# Patient Record
Sex: Female | Born: 1960 | Race: Black or African American | Hispanic: No | Marital: Married | Smoking: Never smoker
Health system: Southern US, Community
[De-identification: ages and names within clinical notes are randomized; demographics above are authoritative.]

---

## 1999-06-22 ENCOUNTER — Other Ambulatory Visit: Admission: RE | Admit: 1999-06-22 | Discharge: 1999-06-22 | Payer: Self-pay | Admitting: Obstetrics and Gynecology

## 2000-02-02 ENCOUNTER — Other Ambulatory Visit: Admission: RE | Admit: 2000-02-02 | Discharge: 2000-02-02 | Payer: Self-pay | Admitting: Unknown Physician Specialty

## 2001-12-17 ENCOUNTER — Ambulatory Visit (HOSPITAL_COMMUNITY): Admission: RE | Admit: 2001-12-17 | Discharge: 2001-12-17 | Payer: Self-pay

## 2001-12-31 ENCOUNTER — Ambulatory Visit (HOSPITAL_COMMUNITY): Admission: RE | Admit: 2001-12-31 | Discharge: 2001-12-31 | Payer: Self-pay | Admitting: *Deleted

## 2001-12-31 ENCOUNTER — Encounter: Payer: Self-pay | Admitting: *Deleted

## 2002-04-10 ENCOUNTER — Inpatient Hospital Stay (HOSPITAL_COMMUNITY): Admission: AD | Admit: 2002-04-10 | Discharge: 2002-04-10 | Payer: Self-pay

## 2002-04-15 ENCOUNTER — Encounter (HOSPITAL_COMMUNITY): Admission: RE | Admit: 2002-04-15 | Discharge: 2002-05-15 | Payer: Self-pay

## 2002-05-20 ENCOUNTER — Encounter (HOSPITAL_COMMUNITY): Admission: RE | Admit: 2002-05-20 | Discharge: 2002-05-20 | Payer: Self-pay

## 2002-05-27 ENCOUNTER — Inpatient Hospital Stay (HOSPITAL_COMMUNITY): Admission: AD | Admit: 2002-05-27 | Discharge: 2002-05-31 | Payer: Self-pay

## 2002-09-28 ENCOUNTER — Encounter: Admission: RE | Admit: 2002-09-28 | Discharge: 2002-10-28 | Payer: Self-pay

## 2002-11-27 ENCOUNTER — Encounter: Admission: RE | Admit: 2002-11-27 | Discharge: 2002-12-27 | Payer: Self-pay

## 2003-01-27 ENCOUNTER — Encounter: Admission: RE | Admit: 2003-01-27 | Discharge: 2003-02-26 | Payer: Self-pay

## 2004-01-06 ENCOUNTER — Other Ambulatory Visit: Admission: RE | Admit: 2004-01-06 | Discharge: 2004-01-06 | Payer: Self-pay | Admitting: Obstetrics and Gynecology

## 2004-09-25 ENCOUNTER — Emergency Department (HOSPITAL_COMMUNITY): Admission: EM | Admit: 2004-09-25 | Discharge: 2004-09-25 | Payer: Self-pay | Admitting: Emergency Medicine

## 2006-12-20 IMAGING — CR DG CHEST 2V
2 series · 2 of 2 positions shown · non-contrast
Comparison: none

CLINICAL DATA: Cough and fever with nausea and vomiting.
 PA AND LATERAL CHEST, 09/25/04:
 The heart size and mediastinal contours are normal.  The lungs are clear.  The visualized skeleton is unremarkable.

[view not recorded (1 of 2)]
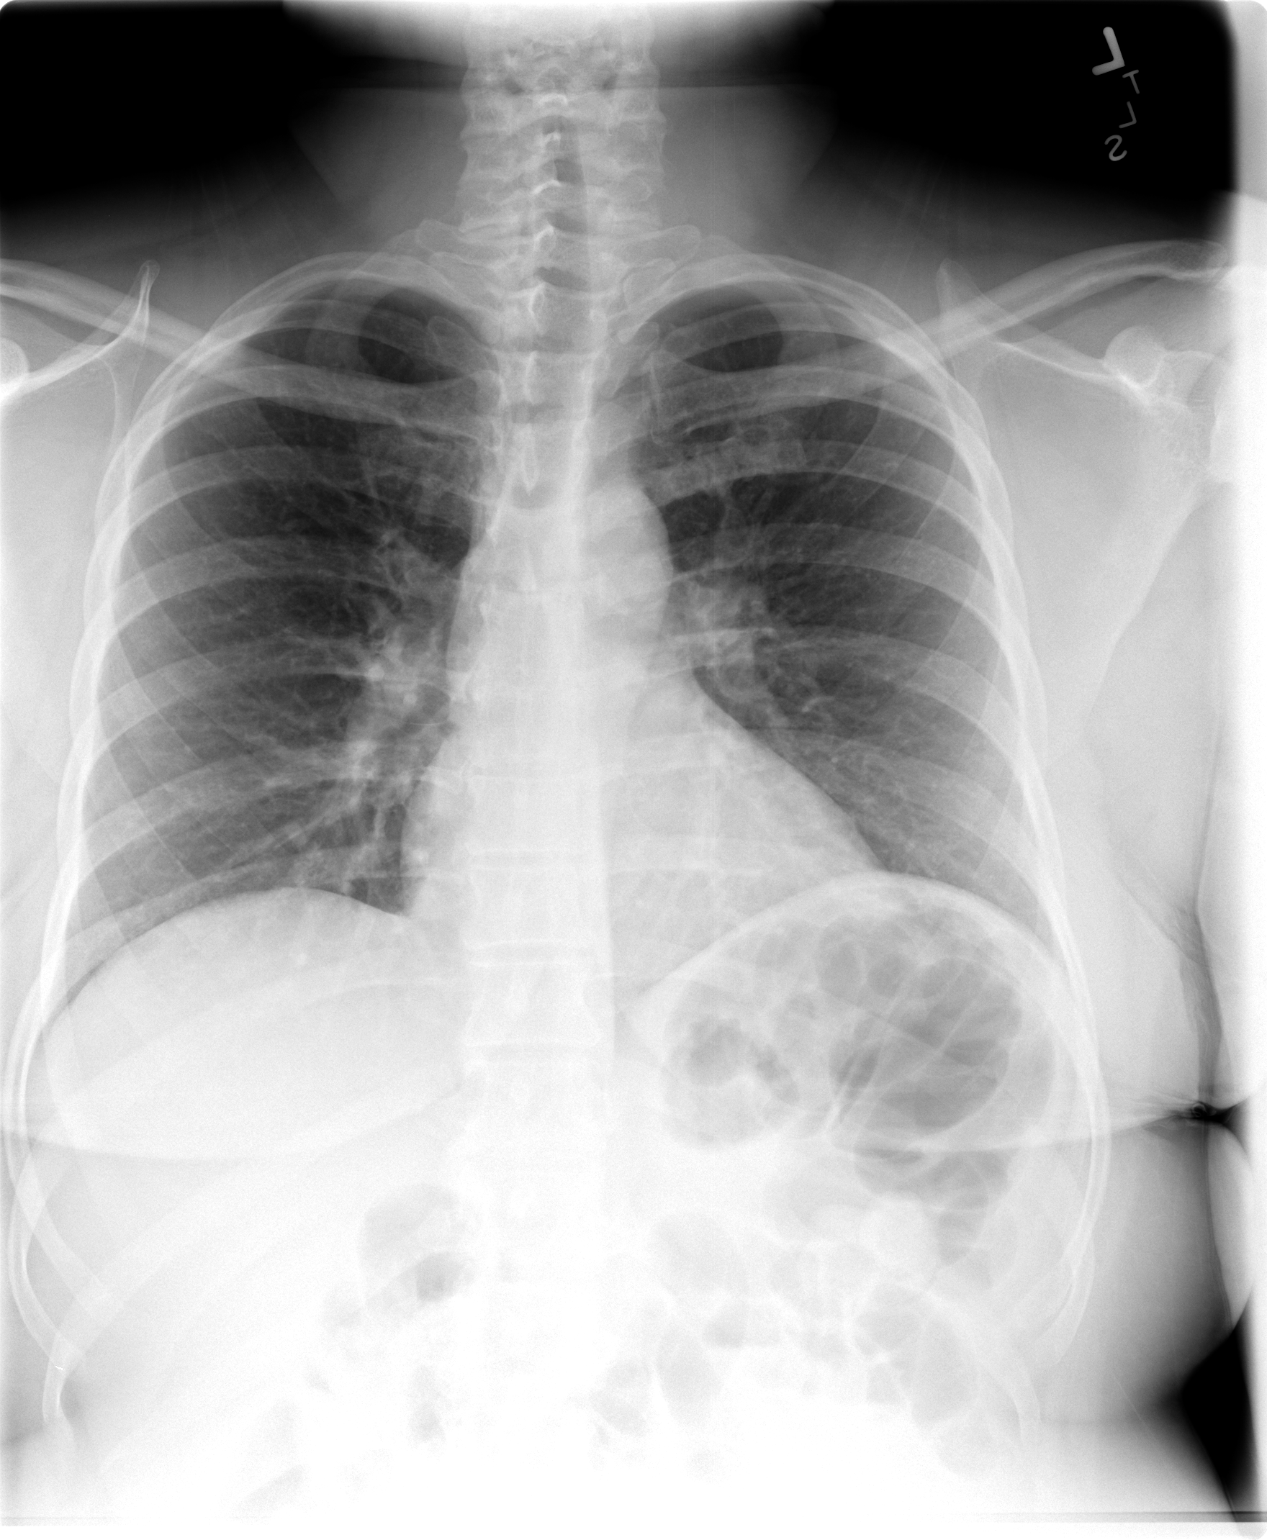

[view not recorded (2 of 2)]
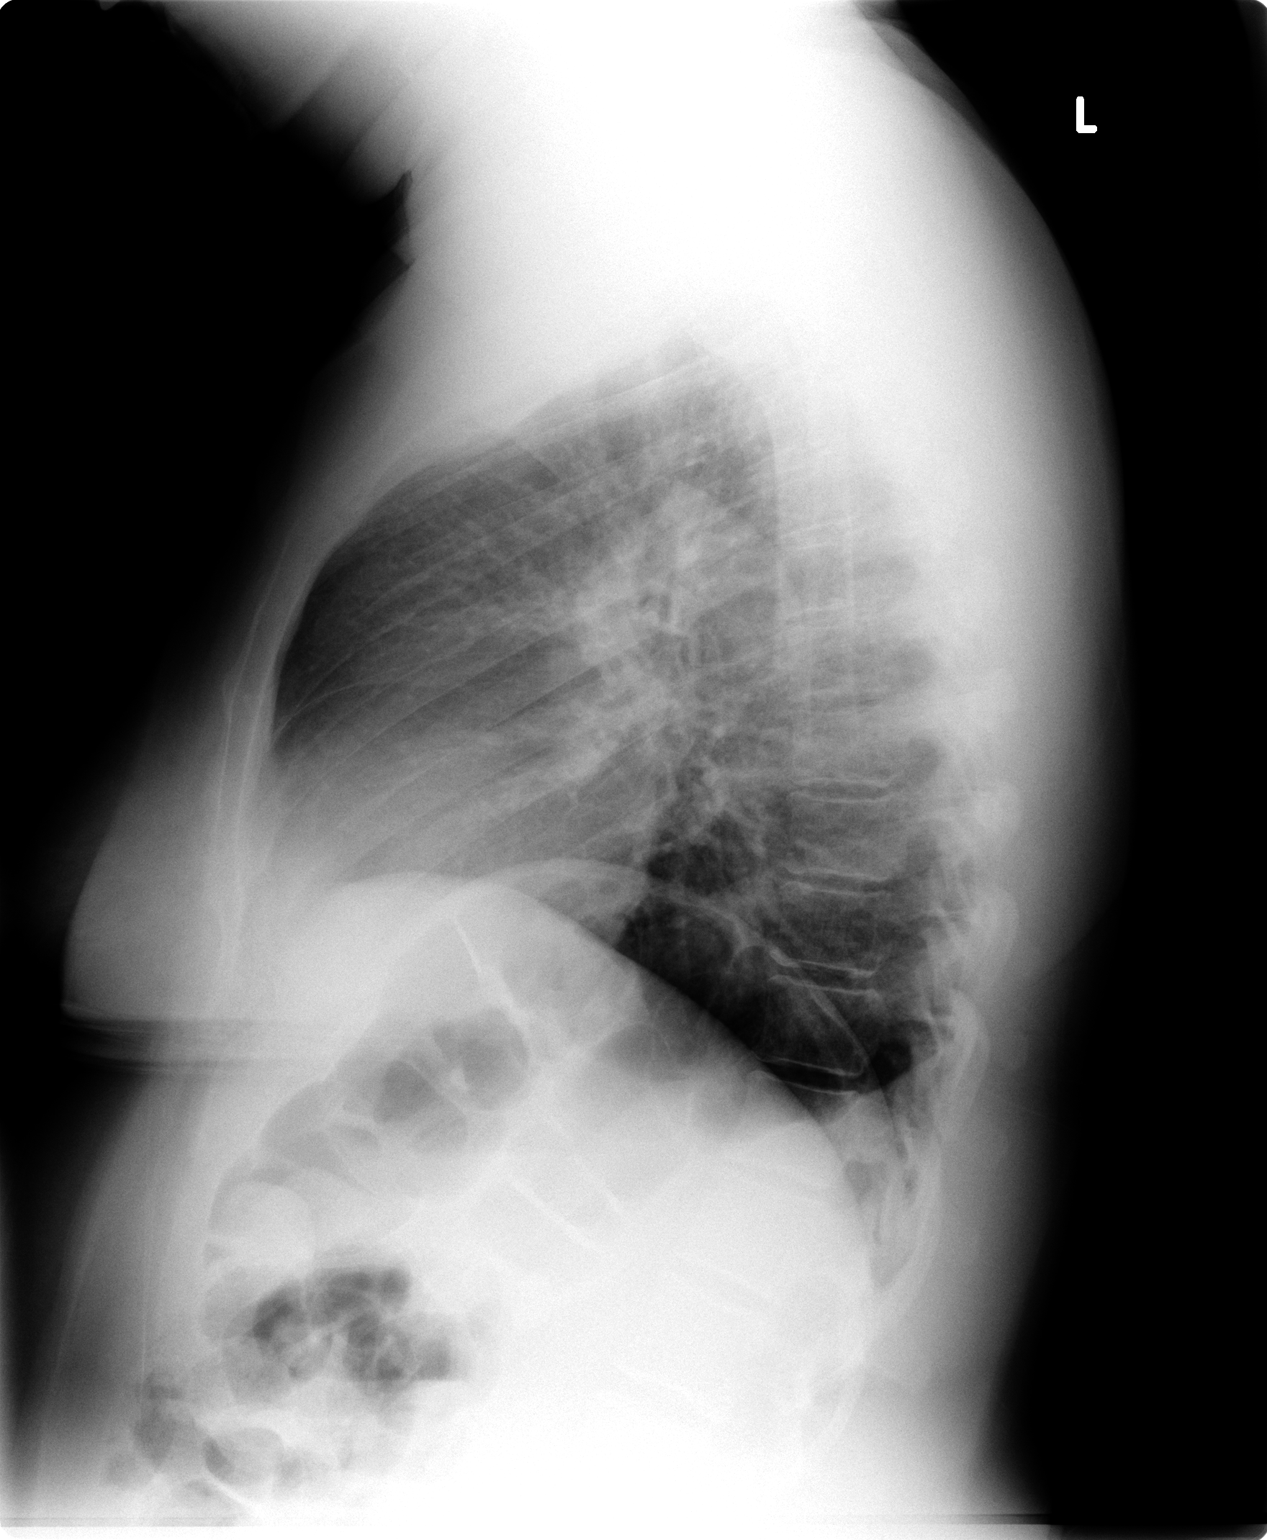

[2 of 2 positions shown; findings below may reference images not displayed]

IMPRESSION: No active disease.

## 2015-07-08 ENCOUNTER — Encounter (HOSPITAL_COMMUNITY): Payer: Self-pay | Admitting: Emergency Medicine

## 2015-07-08 ENCOUNTER — Emergency Department (HOSPITAL_COMMUNITY)
Admission: EM | Admit: 2015-07-08 | Discharge: 2015-07-08 | Disposition: A | Discharge status: Home / Self Care | Payer: BLUE CROSS/BLUE SHIELD | Attending: Emergency Medicine | Admitting: Emergency Medicine

## 2015-07-08 DIAGNOSIS — R6883 Chills (without fever): Secondary | ICD-10-CM | POA: Diagnosis not present

## 2015-07-08 DIAGNOSIS — R109 Unspecified abdominal pain: Secondary | ICD-10-CM | POA: Diagnosis present

## 2015-07-08 DIAGNOSIS — R1013 Epigastric pain: Secondary | ICD-10-CM | POA: Diagnosis not present

## 2015-07-08 DIAGNOSIS — R112 Nausea with vomiting, unspecified: Secondary | ICD-10-CM | POA: Insufficient documentation

## 2015-07-08 DIAGNOSIS — R197 Diarrhea, unspecified: Secondary | ICD-10-CM | POA: Diagnosis not present

## 2015-07-08 LAB — HEPATIC FUNCTION PANEL
ALBUMIN: 3.4 g/dL — AB (ref 3.5–5.0)
ALK PHOS: 102 U/L (ref 38–126)
ALT: 25 U/L (ref 14–54)
AST: 23 U/L (ref 15–41)
BILIRUBIN TOTAL: 0.6 mg/dL (ref 0.3–1.2)
Bilirubin, Direct: 0.1 mg/dL (ref 0.1–0.5)
Indirect Bilirubin: 0.5 mg/dL (ref 0.3–0.9)
Total Protein: 7.1 g/dL (ref 6.5–8.1)

## 2015-07-08 LAB — CBC WITH DIFFERENTIAL/PLATELET
Basophils Absolute: 0.1 10*3/uL (ref 0.0–0.1)
Basophils Relative: 1 %
Eosinophils Absolute: 0.1 10*3/uL (ref 0.0–0.7)
Eosinophils Relative: 1 %
HCT: 39 % (ref 36.0–46.0)
Hemoglobin: 12.5 g/dL (ref 12.0–15.0)
Lymphocytes Relative: 7 %
Lymphs Abs: 0.6 10*3/uL — ABNORMAL LOW (ref 0.7–4.0)
MCH: 25.7 pg — ABNORMAL LOW (ref 26.0–34.0)
MCHC: 32.1 g/dL (ref 30.0–36.0)
MCV: 80.2 fL (ref 78.0–100.0)
Monocytes Absolute: 0.2 10*3/uL (ref 0.1–1.0)
Monocytes Relative: 3 %
Neutro Abs: 7.6 10*3/uL (ref 1.7–7.7)
Neutrophils Relative %: 88 %
Platelets: 293 10*3/uL (ref 150–400)
RBC: 4.86 MIL/uL (ref 3.87–5.11)
RDW: 13.6 % (ref 11.5–15.5)
WBC: 8.6 10*3/uL (ref 4.0–10.5)

## 2015-07-08 LAB — URINALYSIS, ROUTINE W REFLEX MICROSCOPIC
BILIRUBIN URINE: NEGATIVE
GLUCOSE, UA: NEGATIVE mg/dL
KETONES UR: NEGATIVE mg/dL
LEUKOCYTES UA: NEGATIVE
NITRITE: NEGATIVE
PROTEIN: NEGATIVE mg/dL
Specific Gravity, Urine: 1.018 (ref 1.005–1.030)
pH: 7 (ref 5.0–8.0)

## 2015-07-08 LAB — BASIC METABOLIC PANEL
ANION GAP: 9 (ref 5–15)
BUN: 15 mg/dL (ref 6–20)
CALCIUM: 8.7 mg/dL — AB (ref 8.9–10.3)
CO2: 25 mmol/L (ref 22–32)
Chloride: 103 mmol/L (ref 101–111)
Creatinine, Ser: 0.94 mg/dL (ref 0.44–1.00)
GFR calc Af Amer: >60 mL/min (ref >60)
Glucose, Bld: 130 mg/dL — ABNORMAL HIGH (ref 65–99)
POTASSIUM: 3 mmol/L — AB (ref 3.5–5.1)
SODIUM: 137 mmol/L (ref 135–145)

## 2015-07-08 LAB — URINE MICROSCOPIC-ADD ON

## 2015-07-08 LAB — LIPASE, BLOOD: LIPASE: 37 U/L (ref 11–51)

## 2015-07-08 MED ORDER — POTASSIUM CHLORIDE CRYS ER 20 MEQ PO TBCR
60.0000 meq | EXTENDED_RELEASE_TABLET | Freq: Once | ORAL | Status: AC
  Administered 2015-07-08: 60 meq via ORAL
  Filled 2015-07-08: qty 3

## 2015-07-08 MED ORDER — ONDANSETRON 4 MG PO TBDP
4.0000 mg | ORAL_TABLET | Freq: Once | ORAL | Status: AC
  Administered 2015-07-08: 4 mg via ORAL
  Filled 2015-07-08: qty 1

## 2015-07-08 MED ORDER — ONDANSETRON 4 MG PO TBDP: 4.0000 mg | ORAL_TABLET | Freq: Three times a day (TID) | ORAL | Status: AC | PRN

## 2015-07-08 MED ORDER — DICYCLOMINE HCL 10 MG PO CAPS
20.0000 mg | ORAL_CAPSULE | Freq: Once | ORAL | Status: AC
  Administered 2015-07-08: 20 mg via ORAL
  Filled 2015-07-08: qty 2

## 2015-07-08 MED ORDER — RANITIDINE HCL 150 MG/10ML PO SYRP
300.0000 mg | ORAL_SOLUTION | Freq: Once | ORAL | Status: AC
  Administered 2015-07-08: 300 mg via ORAL
  Filled 2015-07-08: qty 20

## 2015-07-08 NOTE — ED Provider Notes (Signed)
CSN: [id number]   Arrival date & time 07/08/15 0001  History  By signing my name below, I, Altamease Oiler, attest that this documentation has been prepared under the direction and in the presence of Everlene Balls, MD. Electronically Signed: Altamease Oiler, ED Scribe. 07/08/2015. 1:42 AM.  Chief Complaint  Patient presents with  . Abdominal Pain  . Emesis  . Diarrhea    HPI The history is provided by the patient. No language interpreter was used.   Christina Wall is a 54 y.o. female who presents to the Emergency Department complaining of new, constant,  "bubbling",  mid abdominal pain with onset 2 days ago at work after eating "bad macaroni and cheese". She states that she applied heat to the abdomen with some pain relief but the pain worsened again after eating Brendolyn Patty. Today Aleve provided some relief in pain PTA. Associated symptoms include n/v/d and chills yesterday. Pt denies fever, dysuria, and hematuria.   History reviewed. No pertinent past medical history.  Past Surgical History  Procedure Laterality Date  . Cesarean section      No family history on file.  Social History  Substance Use Topics  . Smoking status: Never Smoker   . Smokeless tobacco: None  . Alcohol Use: Yes     Review of Systems 10 Systems reviewed and all are negative for acute change except as noted in the HPI. Home Medications   Prior to Admission medications   Not on File    Allergies  Review of patient's allergies indicates no known allergies.  Triage Vitals: BP 159/71 mmHg  Pulse 106  Temp(Src) 99.8 F (37.7 C) (Oral)  Resp 16  Ht 5\' 6"  (1.676 m)  Wt 196 lb (88.905 kg)  BMI 31.65 kg/m2  SpO2 99%  Physical Exam  Constitutional: She is oriented to person, place, and time. She appears well-developed and well-nourished. No distress.  HENT:  Head: Normocephalic and atraumatic.  Nose: Nose normal.  Mouth/Throat: Oropharynx is clear and moist. No oropharyngeal exudate.  Eyes:  Conjunctivae and EOM are normal. Pupils are equal, round, and reactive to light. No scleral icterus.  Neck: Normal range of motion. Neck supple. No JVD present. No tracheal deviation present. No thyromegaly present.  Cardiovascular: Normal rate, regular rhythm and normal heart sounds.  Exam reveals no gallop and no friction rub.   No murmur heard. Pulmonary/Chest: Effort normal and breath sounds normal. No respiratory distress. She has no wheezes. She exhibits no tenderness.  Abdominal: Soft. Bowel sounds are normal. She exhibits no distension and no mass. There is tenderness in the epigastric area. There is no rebound and no guarding.  Mid-epigastric TTP  Musculoskeletal: Normal range of motion. She exhibits no edema or tenderness.  Lymphadenopathy:    She has no cervical adenopathy.  Neurological: She is alert and oriented to person, place, and time. No cranial nerve deficit. She exhibits normal muscle tone.  Skin: Skin is warm and dry. No rash noted. No erythema. No pallor.  Nursing note and vitals reviewed.   ED Course  Procedures   DIAGNOSTIC STUDIES: Oxygen Saturation is 99% on RA, normal by my interpretation.    COORDINATION OF CARE: 1:35 AM Discussed treatment plan which includes lab work, Bentyl, Zofran, Zantac, and potassium chloride with pt at bedside and pt agreed to plan.  Labs Reviewed  CBC WITH DIFFERENTIAL/PLATELET - Abnormal; Notable for the following:    MCH 25.7 (*)    Lymphs Abs 0.6 (*)    All other  components within normal limits  BASIC METABOLIC PANEL - Abnormal; Notable for the following:    Potassium 3.0 (*)    Glucose, Bld 130 (*)    Calcium 8.7 (*)    All other components within normal limits  URINALYSIS, ROUTINE W REFLEX MICROSCOPIC (NOT AT [facility]) - Abnormal; Notable for the following:    Hgb urine dipstick SMALL (*)    All other components within normal limits  URINE MICROSCOPIC-ADD ON - Abnormal; Notable for the following:    Squamous Epithelial /  LPF 0-5 (*)    Bacteria, UA RARE (*)    Casts HYALINE CASTS (*)    All other components within normal limits  HEPATIC FUNCTION PANEL - Abnormal; Notable for the following:    Albumin 3.4 (*)    All other components within normal limits  LIPASE, BLOOD    Imaging Review No results found.  I personally reviewed and evaluated these lab results as a part of my medical decision-making.    MDM   Final diagnoses:  None   Patient presents to the ED for abd pain, N/V/D.  She believes she ate bad food earlier.  Her abdominal pain suggests some gastritis as well.  She was given ranitidine, bentyl, and zofran for treatment.  Will DC with zofran as well.  K+ was replaced.  Labs are unremarkable.  Tachycardia resolved on its own without any intervention.  She appears well and in NAD.  VS remain within her normal limits and she is safe for DC.   I personally performed the services described in this documentation, which was scribed in my presence. The recorded information has been reviewed and is accurate.     Everlene Balls, MD 07/08/15 [phone]

## 2015-07-08 NOTE — ED Notes (Signed)
Pt. reports upper abdominal pain with nausea , vomitting and diarrhea onset Wednesday this week , mild chills / denies fever.

## 2015-07-08 NOTE — Discharge Instructions (Signed)
Abdominal Pain, Adult Christina Wall, your blood work today shows some dehydration.  Be sure to drink plenty of water and take zofran as needed for nausea.  You can return to work on Monday.  See a primary care doctor within 3 days for close follow up.  If symptoms worsen, come back to the ED immediately.  Thank you. Many things can cause belly (abdominal) pain. Most times, the belly pain is not dangerous. Many cases of belly pain can be watched and treated at home. HOME CARE   Do not take medicines that help you go poop (laxatives) unless told to by your doctor.  Only take medicine as told by your doctor.  Eat or drink as told by your doctor. Your doctor will tell you if you should be on a special diet. GET HELP IF:  You do not know what is causing your belly pain.  You have belly pain while you are sick to your stomach (nauseous) or have runny poop (diarrhea).  You have pain while you pee or poop.  Your belly pain wakes you up at night.  You have belly pain that gets worse or better when you eat.  You have belly pain that gets worse when you eat fatty foods.  You have a fever. GET HELP RIGHT AWAY IF:   The pain does not go away within 2 hours.  You keep throwing up (vomiting).  The pain changes and is only in the right or left part of the belly.  You have bloody or tarry looking poop. MAKE SURE YOU:   Understand these instructions.  Will watch your condition.  Will get help right away if you are not doing well or get worse.   This information is not intended to replace advice given to you by your health care provider. Make sure you discuss any questions you have with your health care provider.   Document Released: 01/09/2008 Document Revised: 08/13/2014 Document Reviewed: 04/01/2013 Elsevier Interactive Patient Education [employer].

## 2015-07-08 NOTE — ED Notes (Signed)
Pt verbalized understanding of d/c instructions and has no further questions. Pt stable and NAD.  

## 2015-10-04 DIAGNOSIS — R102 Pelvic and perineal pain: Secondary | ICD-10-CM | POA: Insufficient documentation

## 2015-10-04 DIAGNOSIS — R030 Elevated blood-pressure reading, without diagnosis of hypertension: Secondary | ICD-10-CM | POA: Insufficient documentation

## 2015-10-04 DIAGNOSIS — D259 Leiomyoma of uterus, unspecified: Secondary | ICD-10-CM | POA: Insufficient documentation

## 2015-10-07 DIAGNOSIS — F3342 Major depressive disorder, recurrent, in full remission: Secondary | ICD-10-CM | POA: Insufficient documentation

## 2015-10-07 DIAGNOSIS — F331 Major depressive disorder, recurrent, moderate: Secondary | ICD-10-CM | POA: Insufficient documentation

## 2017-07-04 ENCOUNTER — Other Ambulatory Visit (HOSPITAL_COMMUNITY)
Admission: RE | Admit: 2017-07-04 | Discharge: 2017-07-04 | Disposition: A | Discharge status: Home / Self Care | Payer: BC Managed Care – PPO | Source: Ambulatory Visit | Attending: Nurse Practitioner | Admitting: Nurse Practitioner

## 2017-07-04 ENCOUNTER — Other Ambulatory Visit: Payer: Self-pay | Admitting: Nurse Practitioner

## 2017-07-04 DIAGNOSIS — Z01419 Encounter for gynecological examination (general) (routine) without abnormal findings: Secondary | ICD-10-CM | POA: Diagnosis present

## 2017-07-09 LAB — CYTOLOGY - PAP
Diagnosis: NEGATIVE
HPV (WINDOPATH): NOT DETECTED

## 2017-07-12 DIAGNOSIS — E785 Hyperlipidemia, unspecified: Secondary | ICD-10-CM | POA: Insufficient documentation

## 2018-03-19 ENCOUNTER — Ambulatory Visit (INDEPENDENT_AMBULATORY_CARE_PROVIDER_SITE_OTHER): Payer: BC Managed Care – PPO

## 2018-03-19 ENCOUNTER — Encounter: Payer: Self-pay | Admitting: Podiatry

## 2018-03-19 ENCOUNTER — Ambulatory Visit (INDEPENDENT_AMBULATORY_CARE_PROVIDER_SITE_OTHER): Payer: BC Managed Care – PPO | Admitting: Podiatry

## 2018-03-19 DIAGNOSIS — M778 Other enthesopathies, not elsewhere classified: Secondary | ICD-10-CM

## 2018-03-19 DIAGNOSIS — M779 Enthesopathy, unspecified: Secondary | ICD-10-CM | POA: Diagnosis not present

## 2018-03-19 DIAGNOSIS — M7661 Achilles tendinitis, right leg: Secondary | ICD-10-CM | POA: Diagnosis not present

## 2018-03-19 MED ORDER — METHYLPREDNISOLONE 4 MG PO TBPK: ORAL_TABLET | ORAL | 0 refills | Status: AC

## 2018-03-19 MED ORDER — MELOXICAM 15 MG PO TABS: 15.0000 mg | ORAL_TABLET | Freq: Every day | ORAL | 1 refills | Status: AC

## 2018-03-21 NOTE — Progress Notes (Signed)
   HPI: 57 year old female presenting today as a new patient with a chief complaint of right foot and heel pain that began one year ago. She reports associated pain in the achilles tendon. She has tried orthotics from her PCP and [facility] which helped provide some relief. Walking and bearing weight increases the pain. Patient is here for further evaluation and treatment.   History reviewed. No pertinent past medical history.    Physical Exam: General: The patient is alert and oriented x3 in no acute distress.  Dermatology: Skin is warm, dry and supple bilateral lower extremities. Negative for open lesions or macerations.  Vascular: Palpable pedal pulses bilaterally. No edema or erythema noted. Capillary refill within normal limits.  Neurological: Epicritic and protective threshold grossly intact bilaterally.   Musculoskeletal Exam: Pain on palpation noted to the posterior tubercle of the right calcaneus at the insertion of the Achilles tendon consistent with retrocalcaneal bursitis. Range of motion within normal limits. Muscle strength 5/5 in all muscle groups bilateral lower extremities.  Radiographic Exam:  Posterior calcaneal spur noted to the respective calcaneus on lateral view. No fracture or dislocation noted. Normal osseous mineralization noted.     Assessment: 1. Insertional Achilles tendinitis right 2. Retrocalcaneal bursitis   Plan of Care:  1. Patient was evaluated. Radiographs were reviewed today. 2. Injection of 0.5 mL Celestone Soluspan injected into the retrocalcaneal bursa. Care was taken to avoid direct injection into the Achilles tendon. 3. Prescription for Medrol Dose Pak provided to patient.  4. Prescription for Meloxicam provided to patient.  5. Silicone heel sleeve dispensed.  6. Return to clinic in 4 weeks.   Works 3 jobs. One job is at the [employer].    Edrick Kins, DPM [facility]  Dr. Edrick Kins, [address]                                        [place], [place] [address]                Office [phone]  Fax [phone]

## 2018-04-16 ENCOUNTER — Encounter: Payer: Self-pay | Admitting: Podiatry

## 2018-04-16 ENCOUNTER — Ambulatory Visit: Payer: BC Managed Care – PPO | Admitting: Podiatry

## 2018-04-16 DIAGNOSIS — M7661 Achilles tendinitis, right leg: Secondary | ICD-10-CM | POA: Diagnosis not present

## 2018-04-16 MED ORDER — DICLOFENAC SODIUM 75 MG PO TBEC: 75.0000 mg | DELAYED_RELEASE_TABLET | Freq: Two times a day (BID) | ORAL | 1 refills | Status: DC

## 2018-04-23 NOTE — Progress Notes (Signed)
   HPI: 57 year old female presenting today for follow up evaluation of insertional achilles tendinitis of the right lower extremity. She states the pain had improved until working standing all night last night. Now, she is experiencing pain and associated swelling. She states the Medrol Dose Pak helped alleviate the pain but the Meloxicam did not provide any significant relief. Patient is here for further evaluation and treatment.   History reviewed. No pertinent past medical history.    Physical Exam: General: The patient is alert and oriented x3 in no acute distress.  Dermatology: Skin is warm, dry and supple bilateral lower extremities. Negative for open lesions or macerations.  Vascular: Palpable pedal pulses bilaterally. No edema or erythema noted. Capillary refill within normal limits.  Neurological: Epicritic and protective threshold grossly intact bilaterally.   Musculoskeletal Exam: Pain on palpation noted to the posterior tubercle of the right calcaneus at the insertion of the Achilles tendon consistent with retrocalcaneal bursitis. Range of motion within normal limits. Muscle strength 5/5 in all muscle groups bilateral lower extremities.   Assessment: 1. Insertional Achilles tendinitis right - improved   Plan of Care:  1. Patient was evaluated.  2. Discontinue taking Meloxicam. Prescription for Diclofenac 75 mg #60 provided to patient.  3. Stressed importance of calf stretches.  4. Return to clinic as needed.   Works 3 jobs. One job is at the [employer].    Edrick Kins, DPM [facility]  Dr. Edrick Kins, [address]                                        [place], [place] [address]                Office [phone]  Fax [phone]

## 2018-08-20 ENCOUNTER — Other Ambulatory Visit: Payer: Self-pay | Admitting: Podiatry

## 2019-02-18 ENCOUNTER — Other Ambulatory Visit: Payer: Self-pay | Admitting: *Deleted

## 2019-02-18 DIAGNOSIS — Z20822 Contact with and (suspected) exposure to covid-19: Secondary | ICD-10-CM

## 2019-02-21 LAB — NOVEL CORONAVIRUS, NAA: SARS-CoV-2, NAA: NOT DETECTED

## 2019-07-11 ENCOUNTER — Other Ambulatory Visit: Payer: Self-pay

## 2019-07-11 DIAGNOSIS — Z20822 Contact with and (suspected) exposure to covid-19: Secondary | ICD-10-CM

## 2019-07-14 LAB — NOVEL CORONAVIRUS, NAA: SARS-CoV-2, NAA: NOT DETECTED

## 2019-07-17 ENCOUNTER — Telehealth: Payer: Self-pay | Admitting: Physician Assistant

## 2019-07-17 NOTE — Telephone Encounter (Signed)
Patient is calling back to receive her negative COVID test results. Patient expressed understanding.

## 2020-08-12 ENCOUNTER — Ambulatory Visit: Payer: Self-pay | Attending: Internal Medicine

## 2020-08-12 DIAGNOSIS — Z23 Encounter for immunization: Secondary | ICD-10-CM

## 2020-08-12 NOTE — Progress Notes (Signed)
   Covid-19 Vaccination Clinic  Name:  Christina Wall    MRN: [id number] DOB: [birth year]  08/12/2020  [name] was observed post Covid-19 immunization for 15 minutes without incident. She was provided with Vaccine Information Sheet and instruction to access the V-Safe system.   [name] was instructed to call 911 with any severe reactions post vaccine: Marland Kitchen Difficulty breathing  . Swelling of face and throat  . A fast heartbeat  . A bad rash all over body  . Dizziness and weakness   Immunizations Administered    Name Date Dose VIS Date Route   Pfizer COVID-19 Vaccine 08/12/2020  3:43 PM 0.3 mL 05/25/2020 Intramuscular   Manufacturer: [employer]   Lot: [id number]   [place]: [id number]
